# Patient Record
Sex: Male | Born: 1950 | Race: White | Hispanic: No | Marital: Married | Smoking: Former smoker
Health system: Southern US, Community
[De-identification: ages and names within clinical notes are randomized; demographics above are authoritative.]

## PROBLEM LIST (undated history)

## (undated) DIAGNOSIS — C9000 Multiple myeloma not having achieved remission: Secondary | ICD-10-CM

## (undated) HISTORY — PX: BACK SURGERY: SHX140

---

## 2016-03-29 DIAGNOSIS — C9001 Multiple myeloma in remission: Secondary | ICD-10-CM | POA: Diagnosis not present

## 2016-07-03 DIAGNOSIS — M2578 Osteophyte, vertebrae: Secondary | ICD-10-CM | POA: Diagnosis not present

## 2016-07-03 DIAGNOSIS — R0781 Pleurodynia: Secondary | ICD-10-CM | POA: Diagnosis not present

## 2016-07-03 DIAGNOSIS — M549 Dorsalgia, unspecified: Secondary | ICD-10-CM | POA: Diagnosis not present

## 2016-07-03 DIAGNOSIS — M4185 Other forms of scoliosis, thoracolumbar region: Secondary | ICD-10-CM | POA: Diagnosis not present

## 2016-07-03 DIAGNOSIS — M4125 Other idiopathic scoliosis, thoracolumbar region: Secondary | ICD-10-CM | POA: Diagnosis not present

## 2016-07-03 DIAGNOSIS — F17200 Nicotine dependence, unspecified, uncomplicated: Secondary | ICD-10-CM | POA: Diagnosis not present

## 2016-07-03 DIAGNOSIS — Z880 Allergy status to penicillin: Secondary | ICD-10-CM | POA: Diagnosis not present

## 2016-07-03 DIAGNOSIS — C9001 Multiple myeloma in remission: Secondary | ICD-10-CM | POA: Diagnosis not present

## 2016-07-29 ENCOUNTER — Encounter: Payer: Self-pay | Admitting: Emergency Medicine

## 2016-07-29 ENCOUNTER — Inpatient Hospital Stay
Admission: EM | Admit: 2016-07-29 | Discharge: 2016-07-30 | DRG: 193 | Disposition: A | Discharge status: Home / Self Care | Payer: PPO | Attending: Internal Medicine | Admitting: Internal Medicine

## 2016-07-29 ENCOUNTER — Emergency Department: Payer: PPO

## 2016-07-29 DIAGNOSIS — R0602 Shortness of breath: Secondary | ICD-10-CM

## 2016-07-29 DIAGNOSIS — M549 Dorsalgia, unspecified: Secondary | ICD-10-CM | POA: Diagnosis present

## 2016-07-29 DIAGNOSIS — J189 Pneumonia, unspecified organism: Secondary | ICD-10-CM | POA: Diagnosis not present

## 2016-07-29 DIAGNOSIS — J9600 Acute respiratory failure, unspecified whether with hypoxia or hypercapnia: Secondary | ICD-10-CM | POA: Diagnosis not present

## 2016-07-29 DIAGNOSIS — J9601 Acute respiratory failure with hypoxia: Secondary | ICD-10-CM | POA: Diagnosis present

## 2016-07-29 DIAGNOSIS — Z87891 Personal history of nicotine dependence: Secondary | ICD-10-CM

## 2016-07-29 DIAGNOSIS — Z66 Do not resuscitate: Secondary | ICD-10-CM | POA: Diagnosis present

## 2016-07-29 DIAGNOSIS — C9000 Multiple myeloma not having achieved remission: Secondary | ICD-10-CM | POA: Diagnosis present

## 2016-07-29 DIAGNOSIS — G893 Neoplasm related pain (acute) (chronic): Secondary | ICD-10-CM | POA: Diagnosis present

## 2016-07-29 DIAGNOSIS — Z880 Allergy status to penicillin: Secondary | ICD-10-CM

## 2016-07-29 HISTORY — DX: Multiple myeloma not having achieved remission: C90.00

## 2016-07-29 LAB — CBC WITH DIFFERENTIAL/PLATELET
BASOS PCT: 1 %
Basophils Absolute: 0.1 10*3/uL (ref 0–0.1)
EOS ABS: 0 10*3/uL (ref 0–0.7)
EOS PCT: 0 %
HCT: 34.8 % — ABNORMAL LOW (ref 40.0–52.0)
Hemoglobin: 12.4 g/dL — ABNORMAL LOW (ref 13.0–18.0)
Lymphocytes Relative: 7 %
Lymphs Abs: 0.8 10*3/uL — ABNORMAL LOW (ref 1.0–3.6)
MCH: 34.6 pg — ABNORMAL HIGH (ref 26.0–34.0)
MCHC: 35.7 g/dL (ref 32.0–36.0)
MCV: 96.9 fL (ref 80.0–100.0)
MONO ABS: 0.5 10*3/uL (ref 0.2–1.0)
MONOS PCT: 4 %
Neutro Abs: 9.7 10*3/uL — ABNORMAL HIGH (ref 1.4–6.5)
Neutrophils Relative %: 88 %
PLATELETS: 197 10*3/uL (ref 150–440)
RBC: 3.59 MIL/uL — ABNORMAL LOW (ref 4.40–5.90)
RDW: 13.6 % (ref 11.5–14.5)
WBC: 11 10*3/uL — ABNORMAL HIGH (ref 3.8–10.6)

## 2016-07-29 LAB — COMPREHENSIVE METABOLIC PANEL
ALBUMIN: 3.4 g/dL — AB (ref 3.5–5.0)
ALT: 19 U/L (ref 17–63)
ANION GAP: 11 (ref 5–15)
AST: 30 U/L (ref 15–41)
Alkaline Phosphatase: 69 U/L (ref 38–126)
BILIRUBIN TOTAL: 0.9 mg/dL (ref 0.3–1.2)
BUN: 12 mg/dL (ref 6–20)
CO2: 22 mmol/L (ref 22–32)
Calcium: 9.6 mg/dL (ref 8.9–10.3)
Chloride: 97 mmol/L — ABNORMAL LOW (ref 101–111)
Creatinine, Ser: 0.54 mg/dL — ABNORMAL LOW (ref 0.61–1.24)
GFR calc Af Amer: >60 mL/min (ref >60)
GFR calc non Af Amer: >60 mL/min (ref >60)
GLUCOSE: 133 mg/dL — AB (ref 65–99)
POTASSIUM: 4.1 mmol/L (ref 3.5–5.1)
SODIUM: 130 mmol/L — AB (ref 135–145)
TOTAL PROTEIN: 10.1 g/dL — AB (ref 6.5–8.1)

## 2016-07-29 LAB — LIPASE, BLOOD: Lipase: 32 U/L (ref 11–51)

## 2016-07-29 LAB — BRAIN NATRIURETIC PEPTIDE: B NATRIURETIC PEPTIDE 5: 383 pg/mL — AB (ref 0.0–100.0)

## 2016-07-29 LAB — LACTIC ACID, PLASMA: Lactic Acid, Venous: 1.6 mmol/L (ref 0.5–1.9)

## 2016-07-29 MED ORDER — MORPHINE SULFATE (PF) 4 MG/ML IV SOLN
4.0000 mg | Freq: Once | INTRAVENOUS | Status: AC
  Administered 2016-07-29: 4 mg via INTRAVENOUS
  Filled 2016-07-29: qty 1

## 2016-07-29 MED ORDER — LEVOFLOXACIN IN D5W 750 MG/150ML IV SOLN
750.0000 mg | INTRAVENOUS | Status: DC
  Filled 2016-07-29: qty 150

## 2016-07-29 MED ORDER — LEVOFLOXACIN IN D5W 750 MG/150ML IV SOLN
750.0000 mg | Freq: Once | INTRAVENOUS | Status: AC
  Administered 2016-07-29: 750 mg via INTRAVENOUS
  Filled 2016-07-29: qty 150

## 2016-07-29 MED ORDER — OXYCODONE HCL 5 MG PO TABS
10.0000 mg | ORAL_TABLET | ORAL | Status: DC | PRN
  Administered 2016-07-29: 20 mg via ORAL
  Filled 2016-07-29: qty 4

## 2016-07-29 MED ORDER — SODIUM CHLORIDE 0.9% FLUSH
3.0000 mL | Freq: Two times a day (BID) | INTRAVENOUS | Status: DC
  Administered 2016-07-29: 3 mL via INTRAVENOUS

## 2016-07-29 MED ORDER — IOPAMIDOL (ISOVUE-370) INJECTION 76%
75.0000 mL | Freq: Once | INTRAVENOUS | Status: AC | PRN
  Administered 2016-07-29: 75 mL via INTRAVENOUS

## 2016-07-29 MED ORDER — SODIUM CHLORIDE 0.9% FLUSH: 3.0000 mL | INTRAVENOUS | Status: DC | PRN

## 2016-07-29 MED ORDER — SODIUM CHLORIDE 0.9 % IV SOLN
250.0000 mL | INTRAVENOUS | Status: DC | PRN
Start: 2016-07-29 — End: 2016-07-30

## 2016-07-29 NOTE — ED Notes (Signed)
Patient transported to X-ray 

## 2016-07-29 NOTE — ED Triage Notes (Signed)
Patient presents to the ED with epigastric pain and shortness of breath that began suddenly around 1am this morning.  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

## 2016-07-29 NOTE — ED Notes (Signed)
Attempted to call report to floor, floor nurse unavailable at this time.

## 2016-07-29 NOTE — ED Provider Notes (Signed)
[facility] Emergency Department Provider Note  _________________________________________   I have reviewed the triage vital signs and the nursing notes.   HISTORY  Chief Complaint Shortness of Breath and Abdominal Pain   History limited by: Not Limited   HPI Jose Alvarez is a 65 y.o. male with history of multiple myeloma who presents to the emergency department today because of concern for shortness of breath. The symptoms started this morning around 1 am. He states he had gotten up and after being up for a little while noticed he was short of breath. This shortness of breath got worse and was persistent. He was not able to easily go back to sleep. In addition the patient did feel like he had a slight burning sensation in his chest. He denies any fevers.    Past Medical History:  Diagnosis Date  . Multiple myeloma (HCC)     There are no active problems to display for this patient.   Past Surgical History:  Procedure Laterality Date  . BACK SURGERY      Prior to Admission medications   Medication Sig Start Date End Date Taking? Authorizing Provider  Oxycodone HCl 10 MG TABS Take 10-20 mg by mouth every 4 (four) hours as needed. for pain 07/11/16  Yes Historical Provider, MD    Allergies Patient has no known allergies.  No family history on file.  Social History Social History  Substance Use Topics  . Smoking status: Former Research scientist (life sciences)  . Smokeless tobacco: Never Used  . Alcohol use No    Review of Systems  Constitutional: Negative for fever. Cardiovascular: Positive for sensation of burning in his chest. Respiratory: Positive for shortness of breath. Gastrointestinal: Negative for abdominal pain, vomiting and diarrhea. Neurological: Negative for headaches, focal weakness or numbness.  10-point ROS otherwise negative.  ____________________________________________   PHYSICAL EXAM:  VITAL SIGNS: ED Triage Vitals  Enc Vitals Group   BP 07/29/16 1143 (!) 194/104     Pulse Rate 07/29/16 1143 (!) 109     Resp 07/29/16 1143 (!) 28     Temp 07/29/16 1143 98.2 F (36.8 C)     Temp Source 07/29/16 1143 Oral     SpO2 07/29/16 1143 (!) 88 %     Weight 07/29/16 1144 178 lb (80.7 kg)     Height 07/29/16 1144 5' 8"  (1.727 m)     Head Circumference --      Peak Flow --      Pain Score 07/29/16 1144 9   Constitutional: Alert and oriented. Slightly increased work of breathing. Eyes: Conjunctivae are normal. Normal extraocular movements. ENT   Head: Normocephalic and atraumatic.   Nose: No congestion/rhinnorhea.   Mouth/Throat: Mucous membranes are moist.   Neck: No stridor. Hematological/Lymphatic/Immunilogical: No cervical lymphadenopathy. Cardiovascular: Tachycardic, regular rhythm.  No murmurs, rubs, or gallops.  Respiratory: Increased respiratory effort with tachypnea. No wheezing, rhonchi or crackles appreciated.  Gastrointestinal: Soft and nontender. No distention.  Genitourinary: Deferred Musculoskeletal: Normal range of motion in all extremities. No lower extremity edema. Neurologic:  Normal speech and language. No gross focal neurologic deficits are appreciated.  Skin:  Skin is warm, dry and intact. No rash noted. Psychiatric: Mood and affect are normal. Speech and behavior are normal. Patient exhibits appropriate insight and judgment.  ____________________________________________    LABS (pertinent positives/negatives)  Labs Reviewed  CBC WITH DIFFERENTIAL/PLATELET - Abnormal; Notable for the following:       Result Value   WBC 11.0 (*)  RBC 3.59 (*)    Hemoglobin 12.4 (*)    HCT 34.8 (*)    MCH 34.6 (*)    Neutro Abs 9.7 (*)    Lymphs Abs 0.8 (*)    All other components within normal limits  COMPREHENSIVE METABOLIC PANEL - Abnormal; Notable for the following:    Sodium 130 (*)    Chloride 97 (*)    Glucose, Bld 133 (*)    Creatinine, Ser 0.54 (*)    Total Protein 10.1 (*)    Albumin  3.4 (*)    All other components within normal limits  BRAIN NATRIURETIC PEPTIDE - Abnormal; Notable for the following:    B Natriuretic Peptide 383.0 (*)    All other components within normal limits  LIPASE, BLOOD  LACTIC ACID, PLASMA  CBC     ____________________________________________   EKG  I, Nance Pear, attending physician, personally viewed and interpreted this EKG  EKG Time: 1152 Rate: 105 Rhythm: sinus tachycardia Axis: right axis deviation Intervals: qtc 478 QRS: narrow, q waves V1, V2 ST changes: no st elevation Impression: abnormal ekg   ____________________________________________    RADIOLOGY  CXR IMPRESSION: Scattered airspace opacities in the right upper lung and both lung bases. Findings are consistent with atelectasis versus pneumonia.  CT PE IMPRESSION: No pulmonary emboli.  Previous corpectomy at T7 with fusion from T6 through T8. 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 Canal not well evaluated, but there could be canal involvement.  Extensive tumor elsewhere throughout the thoracic spine with partial compression fractures at T3 and T4 and likely canal involvement by tumor at those levels.  Widespread rib involvement, most pronounced anteriorly on the right at the second and third ribs.  Interstitial pulmonary edema. Patchy areas of pulmonary density that could represent early alveolar edema, atelectasis or pneumonia.  Freely layering pleural effusions.  ____________________________________________   PROCEDURES  Procedures  ____________________________________________   INITIAL IMPRESSION / ASSESSMENT AND PLAN / ED COURSE  Pertinent labs & imaging results that were available during my care of the patient were reviewed by me and considered in my medical decision making (see chart for details).  Patient presented to the emergency department today with shortness breath and sensation of  chest burning. Workup is concerning for possible pneumonias. Patient did not have any evidence of pulmonary embolism on CT scan. Given the patient was hypoxic to the high 80s on room air patient will be admitted for further workup and management of possible pneumonia and hypoxia. ____________________________________________   FINAL CLINICAL IMPRESSION(S) / ED DIAGNOSES  Final diagnoses:  Shortness of breath  Community acquired pneumonia, unspecified laterality     Note: This dictation was prepared with Sales executive. Any transcriptional errors that result from this process are unintentional    Nance Pear, MD 07/29/16 1810

## 2016-07-29 NOTE — H&P (Signed)
Jose Alvarez is an 65 y.o. male.   Chief Complaint: Shortness of breath HPI: This is a 65 year old male who has a history of multiple myeloma. He became short of breath last night after drinking a glass of water. He does not remember choking on it or anything. Here in the ER he was found to be hypoxic. He is currently on no chemotherapy for his multiple myeloma. He does complain of significant back pain which he suffers from chronically because of a vertebral lesion from the multiple myeloma which she's had surgery on before.  Past Medical History:  Diagnosis Date  . Multiple myeloma [facility])     Past Surgical History:  Procedure Laterality Date  . BACK SURGERY      No family history on file. Social History:  reports that he has quit smoking. He has never used smokeless tobacco. He reports that he does not drink alcohol. His drug history is not on file.  Allergies:  Allergies  Allergen Reactions  . Penicillins Other (See Comments)    unknown     (Not in a hospital admission)  Results for orders placed or performed during the hospital encounter of 07/29/16 (from the past 48 hour(s))  CBC with Differential     Status: Abnormal   Collection Time: 07/29/16 12:45 PM  Result Value Ref Range   WBC 11.0 (H) 3.8 - 10.6 K/uL   RBC 3.59 (L) 4.40 - 5.90 MIL/uL   Hemoglobin 12.4 (L) 13.0 - 18.0 g/dL   HCT 34.8 (L) 40.0 - 52.0 %   MCV 96.9 80.0 - 100.0 fL   MCH 34.6 (H) 26.0 - 34.0 pg   MCHC 35.7 32.0 - 36.0 g/dL   RDW 13.6 11.5 - 14.5 %   Platelets 197 150 - 440 K/uL   Neutrophils Relative % 88 %   Neutro Abs 9.7 (H) 1.4 - 6.5 K/uL   Lymphocytes Relative 7 %   Lymphs Abs 0.8 (L) 1.0 - 3.6 K/uL   Monocytes Relative 4 %   Monocytes Absolute 0.5 0.2 - 1.0 K/uL   Eosinophils Relative 0 %   Eosinophils Absolute 0.0 0 - 0.7 K/uL   Basophils Relative 1 %   Basophils Absolute 0.1 0 - 0.1 K/uL  Comprehensive metabolic panel     Status: Abnormal   Collection Time: 07/29/16 12:45 PM  Result  Value Ref Range   Sodium 130 (L) 135 - 145 mmol/L   Potassium 4.1 3.5 - 5.1 mmol/L   Chloride 97 (L) 101 - 111 mmol/L   CO2 22 22 - 32 mmol/L   Glucose, Bld 133 (H) 65 - 99 mg/dL   BUN 12 6 - 20 mg/dL   Creatinine, Ser 0.54 (L) 0.61 - 1.24 mg/dL   Calcium 9.6 8.9 - 10.3 mg/dL   Total Protein 10.1 (H) 6.5 - 8.1 g/dL   Albumin 3.4 (L) 3.5 - 5.0 g/dL   AST 30 15 - 41 U/L   ALT 19 17 - 63 U/L   Alkaline Phosphatase 69 38 - 126 U/L   Total Bilirubin 0.9 0.3 - 1.2 mg/dL   GFR calc non Af Amer >60 >60 mL/min   GFR calc Af Amer >60 >60 mL/min    Comment: (NOTE) The eGFR has been calculated using the CKD EPI equation. This calculation has not been validated in all clinical situations. eGFR's persistently <60 mL/min signify possible Chronic Kidney Disease.    Anion gap 11 5 - 15  Lipase, blood     Status:  None   Collection Time: 07/29/16 12:45 PM  Result Value Ref Range   Lipase 32 11 - 51 U/L  Lactic acid, plasma     Status: None   Collection Time: 07/29/16 12:45 PM  Result Value Ref Range   Lactic Acid, Venous 1.6 0.5 - 1.9 mmol/L  Brain natriuretic peptide     Status: Abnormal   Collection Time: 07/29/16 12:45 PM  Result Value Ref Range   B Natriuretic Peptide 383.0 (H) 0.0 - 100.0 pg/mL   Dg Chest 2 View  Result Date: 07/29/2016 CLINICAL DATA:  Shortness of breath and epigastric pain. EXAM: CHEST  2 VIEW COMPARISON:  None. FINDINGS: The heart size is at the upper limits of normal. Scattered airspace opacities present in the right upper lung, right lung base and left lung base. Findings may be consistent with multifocal pneumonia. There likely is some component of atelectasis, however. No edema or pleural effusions. No pneumothorax. There is evidence of prior mid thoracic corpectomy and fusion. IMPRESSION: Scattered airspace opacities in the right upper lung and both lung bases. Findings are consistent with atelectasis versus pneumonia. Electronically Signed   By: Aletta Edouard  M.D.   On: 07/29/2016 12:49   Ct Angio Chest Pe W And/or Wo Contrast  Result Date: 07/29/2016 CLINICAL DATA:  Shortness of breath with epigastric pain beginning 12 hours ago. History of multiple myeloma. EXAM: CT ANGIOGRAPHY CHEST WITH CONTRAST TECHNIQUE: Multidetector CT imaging of the chest was performed using the standard protocol during bolus administration of intravenous contrast. Multiplanar CT image reconstructions and MIPs were obtained to evaluate the vascular anatomy. CONTRAST:  75 cc Isovue 370 COMPARISON:  Radiography same day FINDINGS: Cardiovascular: Pulmonary arterial opacification is good. There are no pulmonary emboli. No acute aortic pathology. Patient does have coronary artery calcification. Mediastinum/Nodes: No significant anterior or middle mediastinal adenopathy. See below for description of spinal disease with posterior mediastinal mass. Lungs/Pleura: Interstitial markings consistent with edema. Scattered areas of patchy lung density could be atelectasis, areas of early alveolar edema or mild pneumonia. Small amount of freely layering pleural fluid bilaterally. Upper Abdomen: Scans in the upper abdomen do not show any organ pathology or ascites. Musculoskeletal: There is myeloma involvement of the right second and third ribs anteriorly with large soft tissue masses and pleural involvement. There has been previous corpectomy, graft placement and fusion from T6-T8. There is ball T tumor surrounding the spine at this level. Canal involvement is an determine at based on this scan. Lytic tumor involvement is also seen at multiple other thoracic vertebral body levels including T1 through T5, with some loss of height at T3 and T4. The lower thoracic vertebra are also probably involved. Some medial rib involvement is seen throughout that region. There is probably at least some degree of canal involvement at T4 and T3. Review of the MIP images confirms the above findings. IMPRESSION: No pulmonary  emboli. Previous corpectomy at T7 with fusion from T6 through T8. 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 Canal not well evaluated, but there could be canal involvement. Extensive tumor elsewhere throughout the thoracic spine with partial compression fractures at T3 and T4 and likely canal involvement by tumor at those levels. Widespread rib involvement, most pronounced anteriorly on the right at the second and third ribs. Interstitial pulmonary edema. Patchy areas of pulmonary density that could represent early alveolar edema, atelectasis or pneumonia. Freely layering pleural effusions. Electronically Signed   By: Jan Fireman.D.  On: 07/29/2016 14:00    Review of Systems  Constitutional: Negative for chills and fever.  HENT: Negative for hearing loss.   Eyes: Negative for blurred vision.  Respiratory: Positive for sputum production.   Cardiovascular: Negative for chest pain.  Gastrointestinal: Negative for nausea and vomiting.  Genitourinary: Negative for dysuria.  Musculoskeletal: Positive for back pain.  Skin: Negative for rash.  Neurological: Negative for sensory change.    Blood pressure (!) 183/85, pulse 91, temperature 98.2 F (36.8 C), temperature source Oral, resp. rate 18, height 5' 8"  (1.727 m), weight 80.7 kg (178 lb), SpO2 98 %. Physical Exam  Constitutional: He is oriented to person, place, and time. He appears well-developed and well-nourished. No distress.  HENT:  Head: Normocephalic and atraumatic.  Mouth/Throat: Oropharynx is clear and moist. No oropharyngeal exudate.  Eyes: Pupils are equal, round, and reactive to light. No scleral icterus.  Neck: No JVD present. No tracheal deviation present. No thyromegaly present.  Cardiovascular:  Tachycardic with no murmurs  Respiratory:  Scatter rhonchi. No wheezing. No use of accessory muscles. No dullness percussion  GI: Soft. Bowel sounds are normal. He exhibits no mass. There is no  tenderness.  Musculoskeletal: Normal range of motion. He exhibits no edema.  Lymphadenopathy:    He has no cervical adenopathy.  Neurological: He is alert and oriented to person, place, and time. No cranial nerve deficit.  Skin: Skin is warm and dry.     Assessment/Plan 1. Acute respiratory failure. Patient's oxygen saturation was 88% on room air on presentation. He satting mid 90s on 2 L. Likely secondary to his pneumonia. We'll give him oxygen support and treat his underlying cause.  2. Pneumonia. His history gave indication of potential aspiration. However he denies any choking or coughing after drinking the water last night. We'll go ahead and start IV Levaquin to cover. Cultures have been drawn.  3. Chronic back pain. This is secondary to his multiple myeloma. He is on oxycodone and will continue this in the hospital.  4. Multiple myeloma. Patient has elected to do holistic therapy with supplements and is currently not on no medications for this.  5. CODE STATUS. Discussed with patient and he wishes to be DO NOT RESUSCITATE. Next  Time spent 40 minutes  Baxter Hire, MD 07/29/2016, 5:02 PM

## 2016-07-30 DIAGNOSIS — C9000 Multiple myeloma not having achieved remission: Secondary | ICD-10-CM | POA: Diagnosis not present

## 2016-07-30 DIAGNOSIS — J189 Pneumonia, unspecified organism: Secondary | ICD-10-CM | POA: Diagnosis not present

## 2016-07-30 DIAGNOSIS — M549 Dorsalgia, unspecified: Secondary | ICD-10-CM | POA: Diagnosis not present

## 2016-07-30 DIAGNOSIS — J9601 Acute respiratory failure with hypoxia: Secondary | ICD-10-CM | POA: Diagnosis not present

## 2016-07-30 LAB — BASIC METABOLIC PANEL
Anion gap: 7 (ref 5–15)
BUN: 11 mg/dL (ref 6–20)
CHLORIDE: 101 mmol/L (ref 101–111)
CO2: 26 mmol/L (ref 22–32)
Calcium: 9.8 mg/dL (ref 8.9–10.3)
Creatinine, Ser: 0.65 mg/dL (ref 0.61–1.24)
GFR calc Af Amer: >60 mL/min (ref >60)
GFR calc non Af Amer: >60 mL/min (ref >60)
GLUCOSE: 120 mg/dL — AB (ref 65–99)
POTASSIUM: 3.9 mmol/L (ref 3.5–5.1)
Sodium: 134 mmol/L — ABNORMAL LOW (ref 135–145)

## 2016-07-30 LAB — CBC
HEMATOCRIT: 32.2 % — AB (ref 40.0–52.0)
Hemoglobin: 11.2 g/dL — ABNORMAL LOW (ref 13.0–18.0)
MCH: 34.3 pg — ABNORMAL HIGH (ref 26.0–34.0)
MCHC: 34.8 g/dL (ref 32.0–36.0)
MCV: 98.5 fL (ref 80.0–100.0)
PLATELETS: 181 10*3/uL (ref 150–440)
RBC: 3.26 MIL/uL — ABNORMAL LOW (ref 4.40–5.90)
RDW: 13.7 % (ref 11.5–14.5)
WBC: 6.4 10*3/uL (ref 3.8–10.6)

## 2016-07-30 MED ORDER — LEVOFLOXACIN 750 MG PO TABS: 750.0000 mg | ORAL_TABLET | Freq: Every day | ORAL | 0 refills | Status: AC

## 2016-07-30 NOTE — Progress Notes (Signed)
Speech Therapy Note: Received order, reviewed chart notes. Upon coming to the floor to see pt this morning, pt had already discharged and room was empty. NSG confirmed discharge to home.   Orinda Kenner, [place], CCC-SLP

## 2016-07-30 NOTE — Progress Notes (Signed)
Pt being discharged home, discharge instructions and prescription reviewed with pt, states understanding, pt with no complaints, no distress or discomfort noted

## 2016-07-30 NOTE — Progress Notes (Signed)
PT Cancellation Note  Patient Details Name: Keden Risinger MRN: [id number] DOB: [birth year]   Cancelled Treatment:    Reason Eval/Treat Not Completed:  (Evaluation attempted.  Per RN, patient already discharged and out of the building.  )   Janis Cuffe H. Owens Shark, PT, DPT, NCS 07/30/16, 10:10 AM [phone]

## 2016-07-30 NOTE — Progress Notes (Signed)
Chaplain was making his rounds and visited with pt in room 108. Provided the ministry of prayer and emotional support.    07/30/16 1135  Clinical Encounter Type  Visited With Patient  Visit Type Initial;Spiritual support  Referral From Nurse  Spiritual Encounters  Spiritual Needs Prayer;Emotional

## 2016-07-30 NOTE — Discharge Summary (Signed)
[facility] [place] NAME: Jose Alvarez    MR#:  [id number]  DATE OF BIRTH:  [birth year]  DATE OF ADMISSION:  07/29/2016 ADMITTING PHYSICIAN: Baxter Hire, MD  DATE OF DISCHARGE: 07/30/2016  PRIMARY CARE PHYSICIAN: Dillard Essex, MD    ADMISSION DIAGNOSIS:  Shortness of breath [R06.02] Community acquired pneumonia, unspecified laterality [J18.9]  DISCHARGE DIAGNOSIS:  Active Problems:   Pneumonia   SECONDARY DIAGNOSIS:   Past Medical History:  Diagnosis Date  . Multiple myeloma [facility])     HOSPITAL COURSE:   65 year old male with a history of multiple myeloma currently on treatment with [place] medicine therapy who presented with shortness of breath and found to have community-acquired pneumonia.  1. Acute hypoxic respiratory failure in the setting of community-acquired pneumonia: Patient symptoms have subsided. Patient is no longer hypoxic.  2. Community-acquired pneumonia: Patient was started on IV Levaquin and had responded well to this. He will be discharged with 5 days of Levaquin  3. 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 Pain medications  4. Multiple myeloma: Patient has elected to do holistic therapy with supplements.   DISCHARGE CONDITIONS AND DIET:   Stable Regular diet  CONSULTS OBTAINED:    DRUG ALLERGIES:   Allergies  Allergen Reactions  . Penicillins Other (See Comments)    unknown    DISCHARGE MEDICATIONS:   Current Discharge Medication List    START taking these medications   Details  levofloxacin (LEVAQUIN) 750 MG tablet Take 1 tablet (750 mg total) by mouth daily. Qty: 5 tablet, Refills: 0      CONTINUE these medications which have NOT CHANGED   Details  Oxycodone HCl 10 MG TABS Take 10-20 mg by mouth every 4 (four) hours as needed. for pain Refills: 0              Today   CHIEF COMPLAINT:  Patient doing very well this morning. No longer shortness of  breath  VITAL SIGNS:  Blood pressure (!) 173/88, pulse 85, temperature 97.6 F (36.4 C), temperature source Oral, resp. rate 20, height _0  (1.727 m), weight 80 kg (176 lb 6.4 oz), SpO2 97 %.   REVIEW OF SYSTEMS:  Review of Systems  Constitutional: Negative.  Negative for chills, fever and malaise/fatigue.  HENT: Negative.  Negative for ear discharge, ear pain, hearing loss, nosebleeds and sore throat.   Eyes: Negative.  Negative for blurred vision and pain.  Respiratory: Negative.  Negative for cough, hemoptysis, shortness of breath and wheezing.   Cardiovascular: Negative.  Negative for chest pain, palpitations and leg swelling.  Gastrointestinal: Negative.  Negative for abdominal pain, blood in stool, diarrhea, nausea and vomiting.  Genitourinary: Negative.  Negative for dysuria.  Musculoskeletal: Positive for back pain.  Skin: Negative.   Neurological: Negative for dizziness, tremors, speech change, focal weakness, seizures and headaches.  Endo/Heme/Allergies: Negative.  Does not bruise/bleed easily.  Psychiatric/Behavioral: Negative.  Negative for depression, hallucinations and suicidal ideas.     PHYSICAL EXAMINATION:  GENERAL:  66 y.o.-year-old patient lying in the bed with no acute distress.  NECK:  Supple, no jugular venous distention. No thyroid enlargement, no tenderness.  LUNGS: Normal breath sounds bilaterally, no wheezing, rales,rhonchi  No use of accessory muscles of respiration.  CARDIOVASCULAR: S1, S2 normal. No murmurs, rubs, or gallops.  ABDOMEN: Soft, non-tender, non-distended. Bowel sounds present. No organomegaly or mass.  EXTREMITIES: No pedal edema, cyanosis, or clubbing.  PSYCHIATRIC: The patient is alert and  oriented x 3.  SKIN: No obvious rash, lesion, or ulcer.   DATA REVIEW:   CBC  Recent Labs Lab 07/30/16 0418  WBC 6.4  HGB 11.2*  HCT 32.2*  PLT 181    Chemistries   Recent Labs Lab 07/29/16 1245 07/30/16 0418  NA 130* 134*  K 4.1  3.9  CL 97* 101  CO2 22 26  GLUCOSE 133* 120*  BUN 12 11  CREATININE 0.54* 0.65  CALCIUM 9.6 9.8  AST 30  --   ALT 19  --   ALKPHOS 69  --   BILITOT 0.9  --     Cardiac Enzymes No results for input(s): TROPONINI in the last 168 hours.  Microbiology Results  _0 @  RADIOLOGY:  Dg Chest 2 View  Result Date: 07/29/2016 CLINICAL DATA:  Shortness of breath and epigastric pain. EXAM: CHEST  2 VIEW COMPARISON:  None. FINDINGS: The heart size is at the upper limits of normal. Scattered airspace opacities present in the right upper lung, right lung base and left lung base. Findings may be consistent with multifocal pneumonia. There likely is some component of atelectasis, however. No edema or pleural effusions. No pneumothorax. There is evidence of prior mid thoracic corpectomy and fusion. IMPRESSION: Scattered airspace opacities in the right upper lung and both lung bases. Findings are consistent with atelectasis versus pneumonia. Electronically Signed   By: Aletta Edouard M.D.   On: 07/29/2016 12:49   Ct Angio Chest Pe W And/or Wo Contrast  Result Date: 07/29/2016 CLINICAL DATA:  Shortness of breath with epigastric pain beginning 12 hours ago. History of multiple myeloma. EXAM: CT ANGIOGRAPHY CHEST WITH CONTRAST TECHNIQUE: Multidetector CT imaging of the chest was performed using the standard protocol during bolus administration of intravenous contrast. Multiplanar CT image reconstructions and MIPs were obtained to evaluate the vascular anatomy. CONTRAST:  75 cc Isovue 370 COMPARISON:  Radiography same day FINDINGS: Cardiovascular: Pulmonary arterial opacification is good. There are no pulmonary emboli. No acute aortic pathology. Patient does have coronary artery calcification. Mediastinum/Nodes: No significant anterior or middle mediastinal adenopathy. See below for description of spinal disease with posterior mediastinal mass. Lungs/Pleura: Interstitial markings consistent with edema.  Scattered areas of patchy lung density could be atelectasis, areas of early alveolar edema or mild pneumonia. Small amount of freely layering pleural fluid bilaterally. Upper Abdomen: Scans in the upper abdomen do not show any organ pathology or ascites. Musculoskeletal: There is myeloma involvement of the right second and third ribs anteriorly with large soft tissue masses and pleural involvement. There has been previous corpectomy, graft placement and fusion from T6-T8. There is ball T tumor surrounding the spine at this level. Canal involvement is an determine at based on this scan. Lytic tumor involvement is also seen at multiple other thoracic vertebral body levels including T1 through T5, with some loss of height at T3 and T4. The lower thoracic vertebra are also probably involved. Some medial rib involvement is seen throughout that region. There is probably at least some degree of canal involvement at T4 and T3. Review of the MIP images confirms the above findings. IMPRESSION: No pulmonary emboli. Previous corpectomy at T7 with fusion from T6 through T8. 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 Canal not well evaluated, but there could be canal involvement. Extensive tumor elsewhere throughout the thoracic spine with partial compression fractures at T3 and T4 and likely canal involvement by tumor at those levels. Widespread rib involvement, most pronounced anteriorly  on the right at the second and third ribs. Interstitial pulmonary edema. Patchy areas of pulmonary density that could represent early alveolar edema, atelectasis or pneumonia. Freely layering pleural effusions. Electronically Signed   By: Nelson Chimes M.D.   On: 07/29/2016 14:00      Management plans discussed with the patient and he is in agreement. Stable for discharge home  Patient should follow up with pcp  CODE STATUS:     Code Status Orders        Start     Ordered   07/29/16 1720  Do not  attempt resuscitation (DNR)  Continuous    Question Answer Comment  In the event of cardiac or respiratory ARREST Do not call a "code blue"   In the event of cardiac or respiratory ARREST Do not perform Intubation, CPR, defibrillation or ACLS   In the event of cardiac or respiratory ARREST Use medication by any route, position, wound care, and other measures to relive pain and suffering. May use oxygen, suction and manual treatment of airway obstruction as needed for comfort.      07/29/16 1719    Code Status History    Date Active Date Inactive Code Status Order ID Comments User Context   This patient has a current code status but no historical code status.      TOTAL TIME TAKING CARE OF THIS PATIENT: 36 minutes.    Note: This dictation was prepared with Dragon dictation along with smaller phrase technology. Any transcriptional errors that result from this process are unintentional.  Ahad Colarusso M.D on 07/30/2016 at 10:03 AM  Between 7am to 6pm - Pager - [phone] After 6pm go to [email or web address] - password [email or web address]  Office  [phone]  CC: Primary care physician; Dillard Essex, MD

## 2016-07-30 NOTE — Care Management Important Message (Signed)
Important Message  Patient Details  Name: Behruz Easterwood MRN: [id number] Date of Birth: 1951-03-09   Medicare Important Message Given:  Yes    Shelbie Ammons, RN 07/30/2016, 8:21 AM

## 2017-02-08 IMAGING — CR DG CHEST 2V
2 series · 2 of 2 positions shown · non-contrast
Comparison: None.

CLINICAL DATA: Shortness of breath and epigastric pain.

EXAM:
CHEST  2 VIEW

[chest pa]
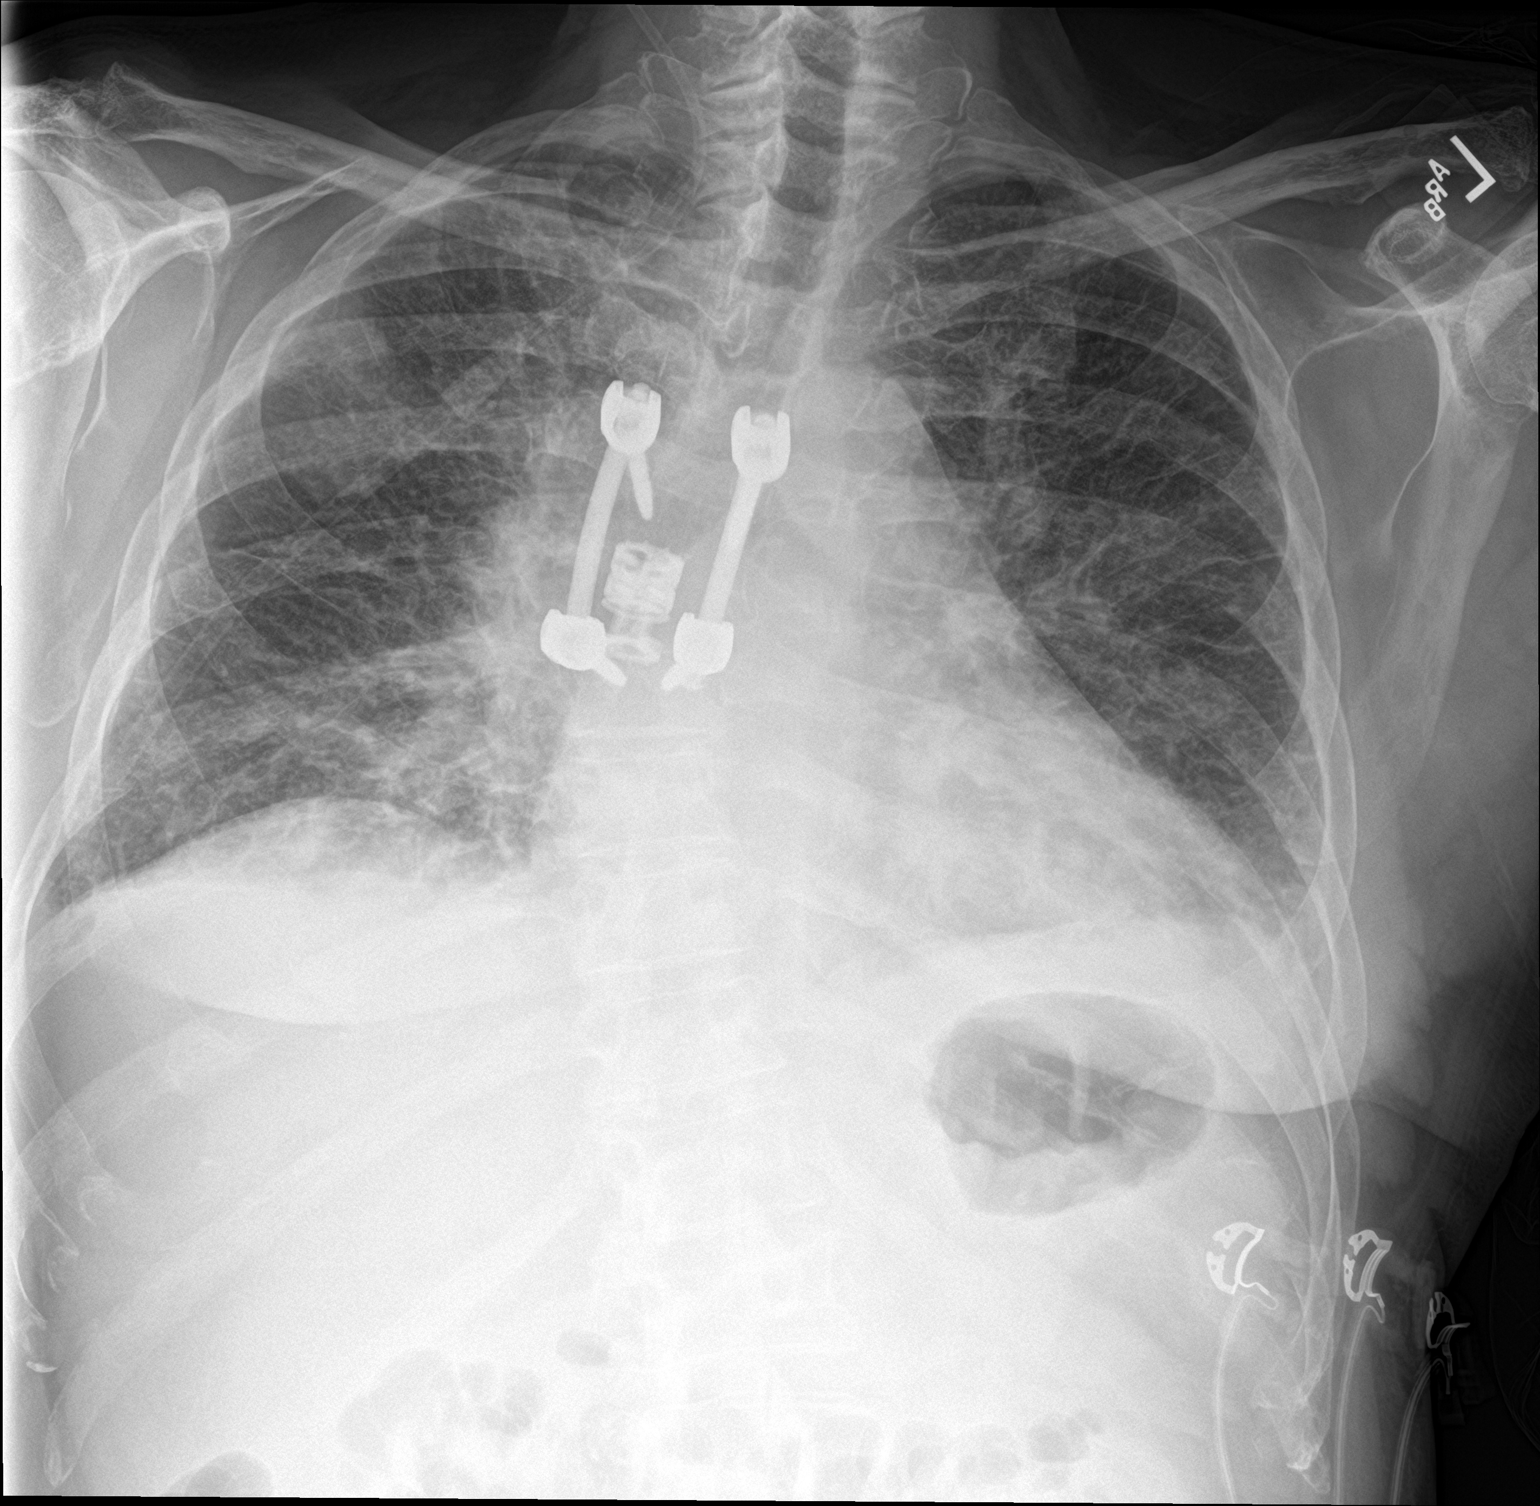

[chest lat]
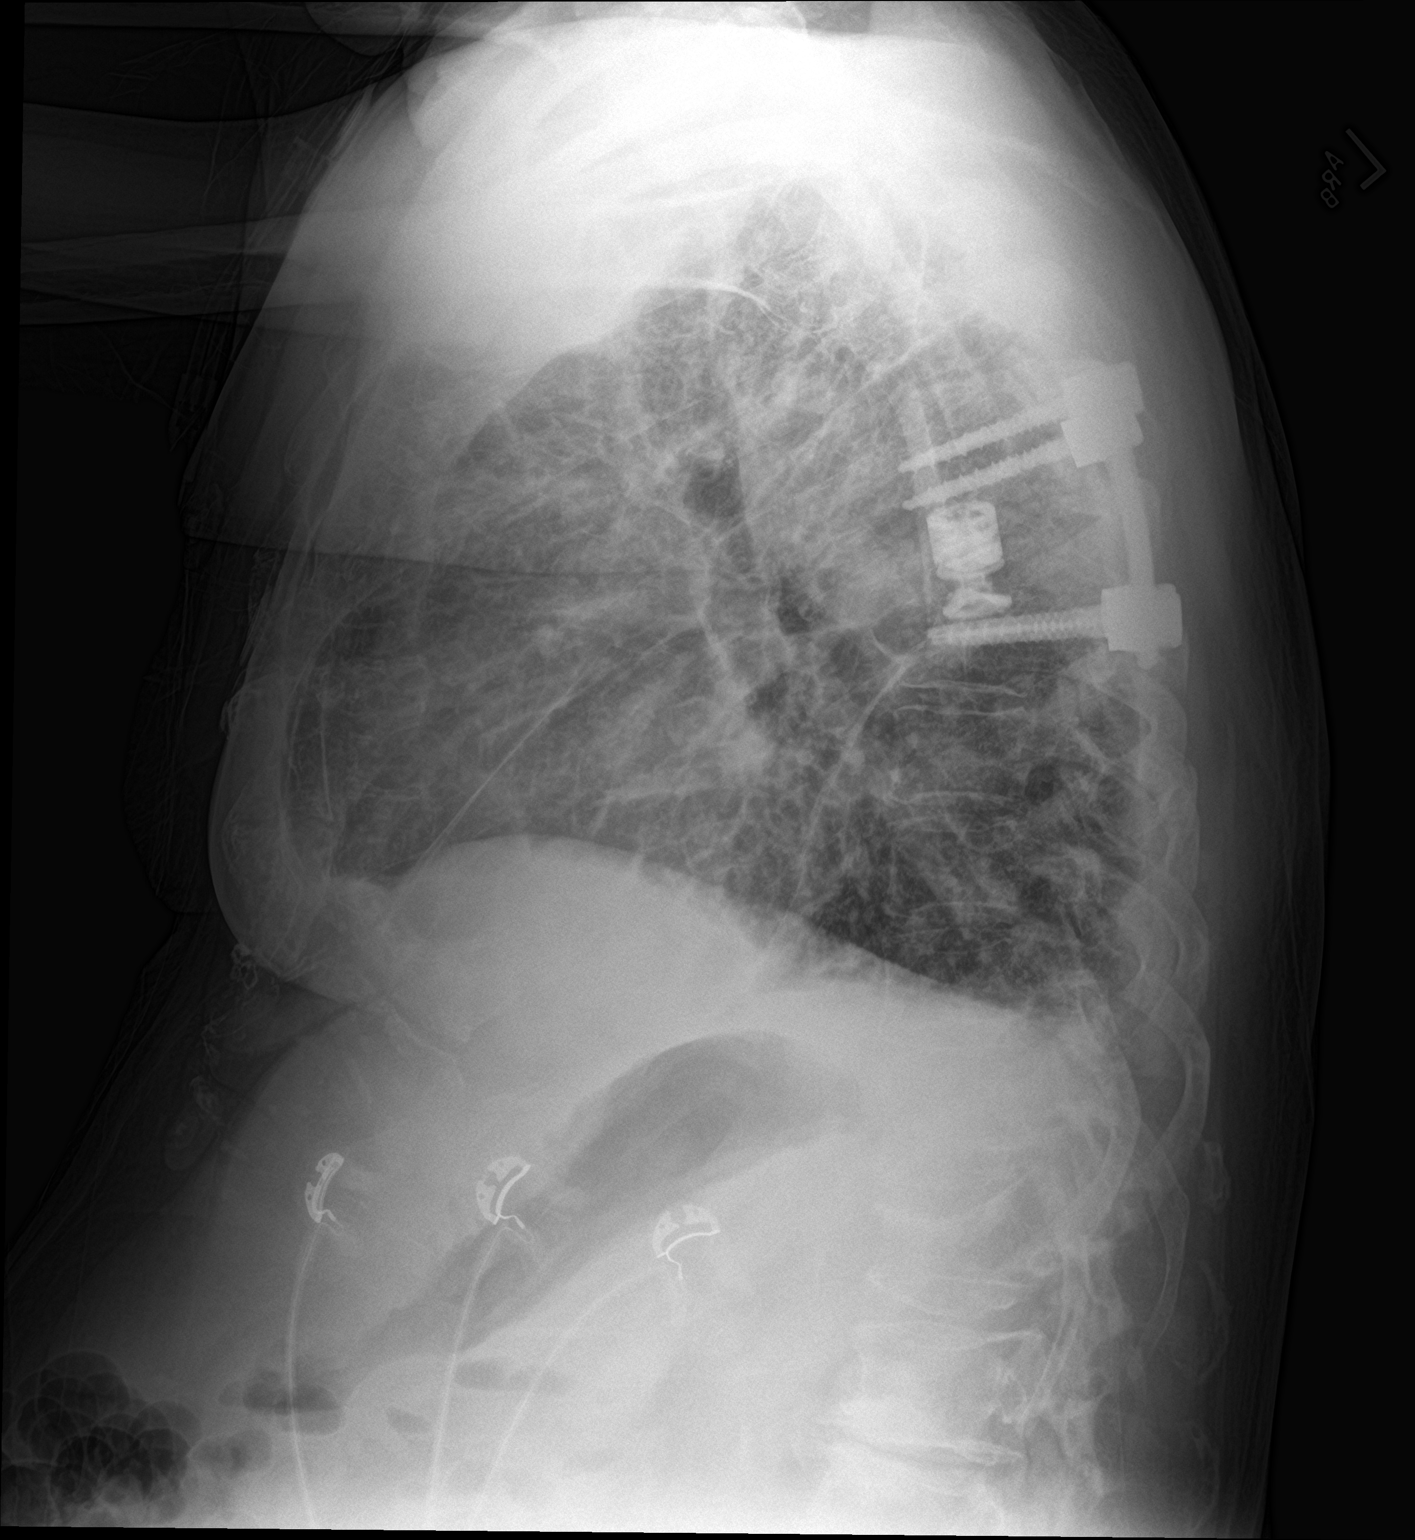

[2 of 2 positions shown; findings below may reference images not displayed]

FINDINGS: The heart size is at the upper limits of normal. Scattered airspace
opacities present in the right upper lung, right lung base and left
lung base. Findings may be consistent with multifocal pneumonia.
There likely is some component of atelectasis, however. No edema or
pleural effusions. No pneumothorax. There is evidence of prior mid
thoracic corpectomy and fusion.
IMPRESSION: Scattered airspace opacities in the right upper lung and both lung
bases. Findings are consistent with atelectasis versus pneumonia.

## 2017-05-18 DEATH — deceased
